# Patient Record
Sex: Male | Born: 1995 | Race: Black or African American | Hispanic: No | Marital: Single | Smoking: Current every day smoker
Health system: Southern US, Community
[De-identification: ages and names within clinical notes are randomized; demographics above are authoritative.]

## PROBLEM LIST (undated history)

## (undated) DIAGNOSIS — F4310 Post-traumatic stress disorder, unspecified: Secondary | ICD-10-CM

## (undated) DIAGNOSIS — Z8673 Personal history of transient ischemic attack (TIA), and cerebral infarction without residual deficits: Secondary | ICD-10-CM

## (undated) DIAGNOSIS — F32A Depression, unspecified: Secondary | ICD-10-CM

## (undated) DIAGNOSIS — S62309A Unspecified fracture of unspecified metacarpal bone, initial encounter for closed fracture: Secondary | ICD-10-CM

## (undated) DIAGNOSIS — J45909 Unspecified asthma, uncomplicated: Secondary | ICD-10-CM

## (undated) DIAGNOSIS — F329 Major depressive disorder, single episode, unspecified: Secondary | ICD-10-CM

## (undated) DIAGNOSIS — J302 Other seasonal allergic rhinitis: Secondary | ICD-10-CM

---
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
## 2016-01-23 ENCOUNTER — Ambulatory Visit (HOSPITAL_COMMUNITY)
Admission: RE | Admit: 2016-01-23 | Discharge: 2016-01-23 | Disposition: A | Discharge status: Home / Self Care | Payer: Medicaid Other | Source: Ambulatory Visit | Attending: Maternal and Fetal Medicine | Admitting: Maternal and Fetal Medicine

## 2016-01-23 DIAGNOSIS — Z31440 Encounter of male for testing for genetic disease carrier status for procreative management: Secondary | ICD-10-CM | POA: Diagnosis not present

## 2016-01-24 LAB — HEMOGLOBINOPATHY EVALUATION
HGB S QUANTITAION: 33.2 % — AB
Hgb A2 Quant: 4.1 % — ABNORMAL HIGH (ref 0.7–3.1)
Hgb A: 62.7 % — ABNORMAL LOW (ref 94.0–98.0)
Hgb C: 0 %
Hgb F Quant: 0 % (ref 0.0–2.0)

## 2016-04-14 ENCOUNTER — Emergency Department (HOSPITAL_COMMUNITY)
Admission: EM | Admit: 2016-04-14 | Discharge: 2016-04-14 | Disposition: A | Discharge status: Home / Self Care | Payer: Medicaid Other | Attending: Emergency Medicine | Admitting: Emergency Medicine

## 2016-04-14 ENCOUNTER — Encounter (HOSPITAL_COMMUNITY): Payer: Self-pay | Admitting: Emergency Medicine

## 2016-04-14 DIAGNOSIS — Z91040 Latex allergy status: Secondary | ICD-10-CM | POA: Insufficient documentation

## 2016-04-14 DIAGNOSIS — Z8673 Personal history of transient ischemic attack (TIA), and cerebral infarction without residual deficits: Secondary | ICD-10-CM | POA: Insufficient documentation

## 2016-04-14 DIAGNOSIS — L818 Other specified disorders of pigmentation: Secondary | ICD-10-CM | POA: Diagnosis not present

## 2016-04-14 DIAGNOSIS — J45909 Unspecified asthma, uncomplicated: Secondary | ICD-10-CM | POA: Insufficient documentation

## 2016-04-14 DIAGNOSIS — F17290 Nicotine dependence, other tobacco product, uncomplicated: Secondary | ICD-10-CM | POA: Diagnosis not present

## 2016-04-14 DIAGNOSIS — L923 Foreign body granuloma of the skin and subcutaneous tissue: Secondary | ICD-10-CM

## 2016-04-14 DIAGNOSIS — Z79899 Other long term (current) drug therapy: Secondary | ICD-10-CM | POA: Insufficient documentation

## 2016-04-14 DIAGNOSIS — L03114 Cellulitis of left upper limb: Secondary | ICD-10-CM

## 2016-04-14 LAB — I-STAT CG4 LACTIC ACID, ED: Lactic Acid, Venous: 1.4 mmol/L (ref 0.5–1.9)

## 2016-04-14 LAB — COMPREHENSIVE METABOLIC PANEL
ALK PHOS: 87 U/L (ref 38–126)
ALT: 24 U/L (ref 17–63)
AST: 25 U/L (ref 15–41)
Albumin: 4.1 g/dL (ref 3.5–5.0)
Anion gap: 8 (ref 5–15)
BILIRUBIN TOTAL: 1.4 mg/dL — AB (ref 0.3–1.2)
BUN: 9 mg/dL (ref 6–20)
CALCIUM: 9.6 mg/dL (ref 8.9–10.3)
CO2: 27 mmol/L (ref 22–32)
CREATININE: 0.95 mg/dL (ref 0.61–1.24)
Chloride: 103 mmol/L (ref 101–111)
Glucose, Bld: 94 mg/dL (ref 65–99)
Potassium: 3.7 mmol/L (ref 3.5–5.1)
Sodium: 138 mmol/L (ref 135–145)
TOTAL PROTEIN: 7.3 g/dL (ref 6.5–8.1)

## 2016-04-14 LAB — CBC WITH DIFFERENTIAL/PLATELET
BASOS ABS: 0 10*3/uL (ref 0.0–0.1)
Basophils Relative: 1 %
EOS ABS: 0.4 10*3/uL (ref 0.0–0.7)
Eosinophils Relative: 8 %
HCT: 42.2 % (ref 39.0–52.0)
Hemoglobin: 14 g/dL (ref 13.0–17.0)
Lymphocytes Relative: 37 %
Lymphs Abs: 1.7 10*3/uL (ref 0.7–4.0)
MCH: 26 pg (ref 26.0–34.0)
MCHC: 33.2 g/dL (ref 30.0–36.0)
MCV: 78.4 fL (ref 78.0–100.0)
Monocytes Absolute: 0.3 10*3/uL (ref 0.1–1.0)
Monocytes Relative: 7 %
Neutro Abs: 2.2 10*3/uL (ref 1.7–7.7)
Neutrophils Relative %: 47 %
Platelets: 123 10*3/uL — ABNORMAL LOW (ref 150–400)
RBC: 5.38 MIL/uL (ref 4.22–5.81)
RDW: 13.5 % (ref 11.5–15.5)
WBC: 4.7 10*3/uL (ref 4.0–10.5)

## 2016-04-14 MED ORDER — CLINDAMYCIN HCL 300 MG PO CAPS: 300.0000 mg | ORAL_CAPSULE | Freq: Four times a day (QID) | ORAL | 0 refills | Status: DC

## 2016-04-14 NOTE — ED Provider Notes (Addendum)
[facility] Provider Note   CSN: [id number] Arrival date & time: 04/14/16  0919     History   Chief Complaint Chief Complaint  Patient presents with  . Cellulitis    HPI Jason Harrell is a 21 y.o. male.  Patient is a 21 year old male with past mental history of asthma. He presents for evaluation of a possible infected tattoo. He reports having a tattoo to his left forearm performed by his uncle several days ago. Yesterday the area began to swell. He denies any fevers or chills. He denies any significant drainage.   The history is provided by the patient.    Past Medical History:  Diagnosis Date  . Asthma    prn inhaler  . Depression   . 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 Metacarpal bone fracture 08/20/2014   midshaft right ring and small  . PTSD (post-traumatic stress disorder)   . Seasonal allergies     Patient Active Problem List   Diagnosis Date Noted  . MDD (major depressive disorder), recurrent episode, severe (HCC) 06/01/2014  . Generalized anxiety disorder 06/01/2014  . Aggression 06/01/2014    Past Surgical History:  Procedure Laterality Date  . OPEN REDUCTION INTERNAL FIXATION (ORIF) METACARPAL Right 08/28/2014   Procedure: OPEN REDUCTION INTERNAL FIXATION (ORIF) RIGHT RING AND RIGHT SMALL FINGER METACARPAL FRACTURES ;  Surgeon: Dairl Ponder, MD;  Location: [facility];  Service: Orthopedics;  Laterality: Right;  . PERIPHERALLY INSERTED CENTRAL CATHETER INSERTION  2007   was sedated for insertion       Home Medications    Prior to Admission medications   Medication Sig Start Date End Date Taking? Authorizing Provider  albuterol (PROVENTIL HFA;VENTOLIN HFA) 108 (90 BASE) MCG/ACT inhaler Inhale 2 puffs into the lungs every 6 (six) hours as needed for wheezing or shortness of breath.     Historical Provider, MD  amoxicillin-clavulanate (AUGMENTIN) 875-125 MG per tablet Take 1 tablet by mouth  every 12 (twelve) hours. 09/22/14   Catha Gosselin, PA-C  clotrimazole (LOTRIMIN) 1 % cream Apply to affected area 2 times daily 11/24/15   Lorre Nick, MD  EPINEPHrine [facility]) 0.15 MG/0.3ML injection Inject 0.15 mg into the muscle daily as needed for anaphylaxis.     Historical Provider, MD  ibuprofen (ADVIL,MOTRIN) 600 MG tablet Take 1 tablet (600 mg total) by mouth every 6 (six) hours as needed for mild pain. 08/20/14   Marcellina Millin, MD  loratadine (CLARITIN) 10 MG tablet Take 10 mg by mouth daily.    Historical Provider, MD  mirtazapine (REMERON) 7.5 MG tablet Take 1 tablet (7.5 mg total) by mouth at bedtime. 06/08/14   Gayland Curry, MD  naproxen (NAPROSYN) 500 MG tablet Take 1 tablet (500 mg total) by mouth 2 (two) times daily. 09/22/14   Hanna Patel-Mills, PA-C  oxyCODONE-acetaminophen (ROXICET) 5-325 MG per tablet Take 1 tablet by mouth every 4 (four) hours as needed for severe pain. 08/28/14   Dairl Ponder, MD    Family History Family History  Problem Relation Age of Onset  . Sickle cell trait Mother     Social History Social History  Substance Use Topics  . Smoking status: Current Every Day Smoker    Years: 0.50    Types: Cigars  . Smokeless tobacco: Never Used     Comment: Black and Mild  . Alcohol use No     Allergies   Anchovies [fish allergy]; Penicillins;  Latex; and Lidocaine   Review of Systems Review of Systems  All other systems reviewed and are negative.    Physical Exam Updated Vital Signs BP 143/85 (BP Location: Right Arm)   Pulse 78   Temp 98 F (36.7 C) (Oral)   Resp 20   Ht 5\' 9"  (1.753 m)   Wt 215 lb (97.5 kg)   SpO2 99%   BMI 31.75 kg/m   Physical Exam  Constitutional: He is oriented to person, place, and time. He appears well-developed and well-nourished. No distress.  HENT:  Head: Normocephalic and atraumatic.  Neck: Normal range of motion. Neck supple.  Musculoskeletal: Normal range of motion.  The volar aspect of the  left forearm is noted to have a newly applied tattoo. There is what appears to be some necrotic tissue along with surrounding erythema present. (See attached photo). There is no significant drainage. He has good range of motion of the forearm without significant discomfort. Ulnar and radial pulses are palpable. Motor and sensation are intact throughout the entire hand.  Neurological: He is alert and oriented to person, place, and time.  Skin: Skin is warm and dry. He is not diaphoretic.  Nursing note and vitals reviewed.      ED Treatments / Results  Labs (all labs ordered are listed, but only abnormal results are displayed) Labs Reviewed  COMPREHENSIVE METABOLIC PANEL  CBC WITH DIFFERENTIAL/PLATELET  I-STAT CG4 LACTIC ACID, ED    EKG  EKG Interpretation None       Radiology No results found.  Procedures Procedures (including critical care time)  Medications Ordered in ED Medications - No data to display   Initial Impression / Assessment and Plan / ED Course  I have reviewed the triage vital signs and the nursing notes.  Pertinent labs & imaging results that were available during my care of the patient were reviewed by me and considered in my medical decision making (see chart for details).  Patient with cellulitis versus local reaction related to recent tattoo performed at home by his uncle. He will be treated with antibiotics and when necessary return.  I have discussed this with Dr. Janee Mornhompson from hand surgery who does not feel as though any further intervention or evaluation is necessary (with the exception of antibiotics).  Final Clinical Impressions(s) / ED Diagnoses   Final diagnoses:  None    New Prescriptions New Prescriptions   No medications on file     Geoffery Lyonsouglas Samaad Hashem, MD 04/14/16 1109    Geoffery Lyonsouglas Sherel Fennell, MD 04/15/16 [phone]

## 2016-04-14 NOTE — Discharge Instructions (Signed)
Clindamycin as prescribed.  Return to the emergency department for worsening pain, high fevers, increased redness or drainage, or for other new and concerning symptoms.

## 2016-04-14 NOTE — ED Triage Notes (Signed)
Pt reports getting a tattoo last Tuesday, noticed yesterday that skin around tattoo was painful and draining. Pt denies fever/chills.

## 2016-04-14 NOTE — ED Notes (Signed)
bactrin ointment applied to tattoo and wrap with ace bandage. Discharge instructions and prescriptions reviewed. Pt verbalized understanding.

## 2016-08-21 IMAGING — DX DG ANKLE COMPLETE 3+V*R*
3 series · 3 of 3 positions shown · non-contrast
Comparison: None

CLINICAL DATA: Dog bite to the anterior right ankle today.

EXAM:
RIGHT ANKLE - COMPLETE 3+ VIEW

[ankle ap]
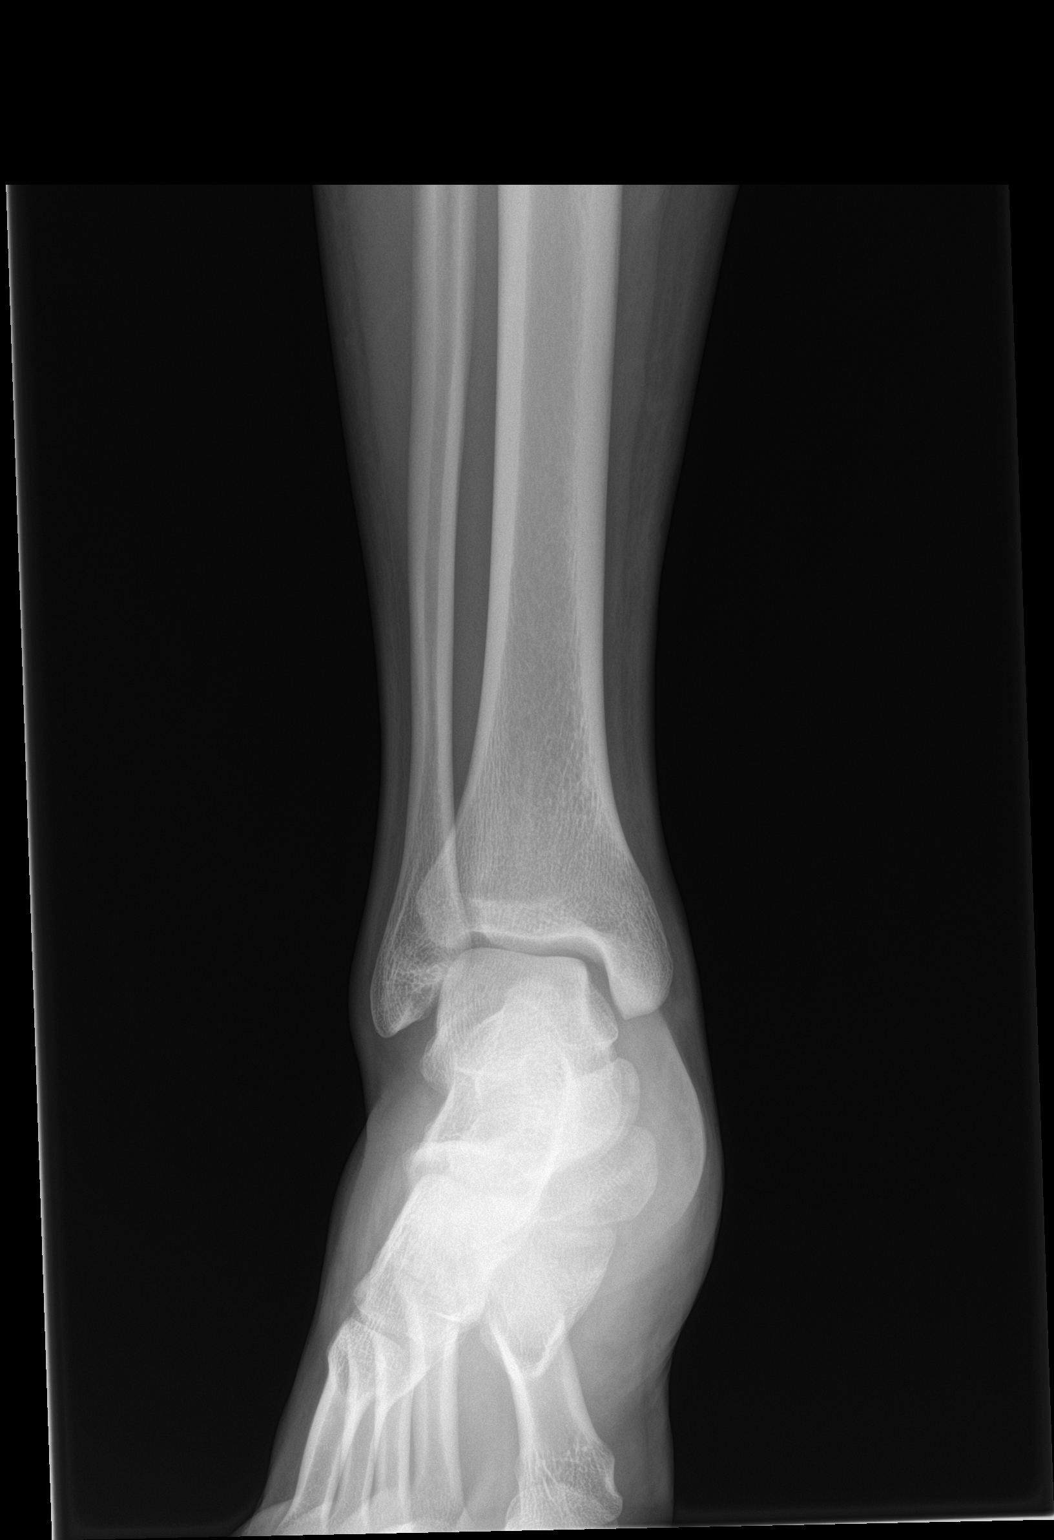

[ankle obl]
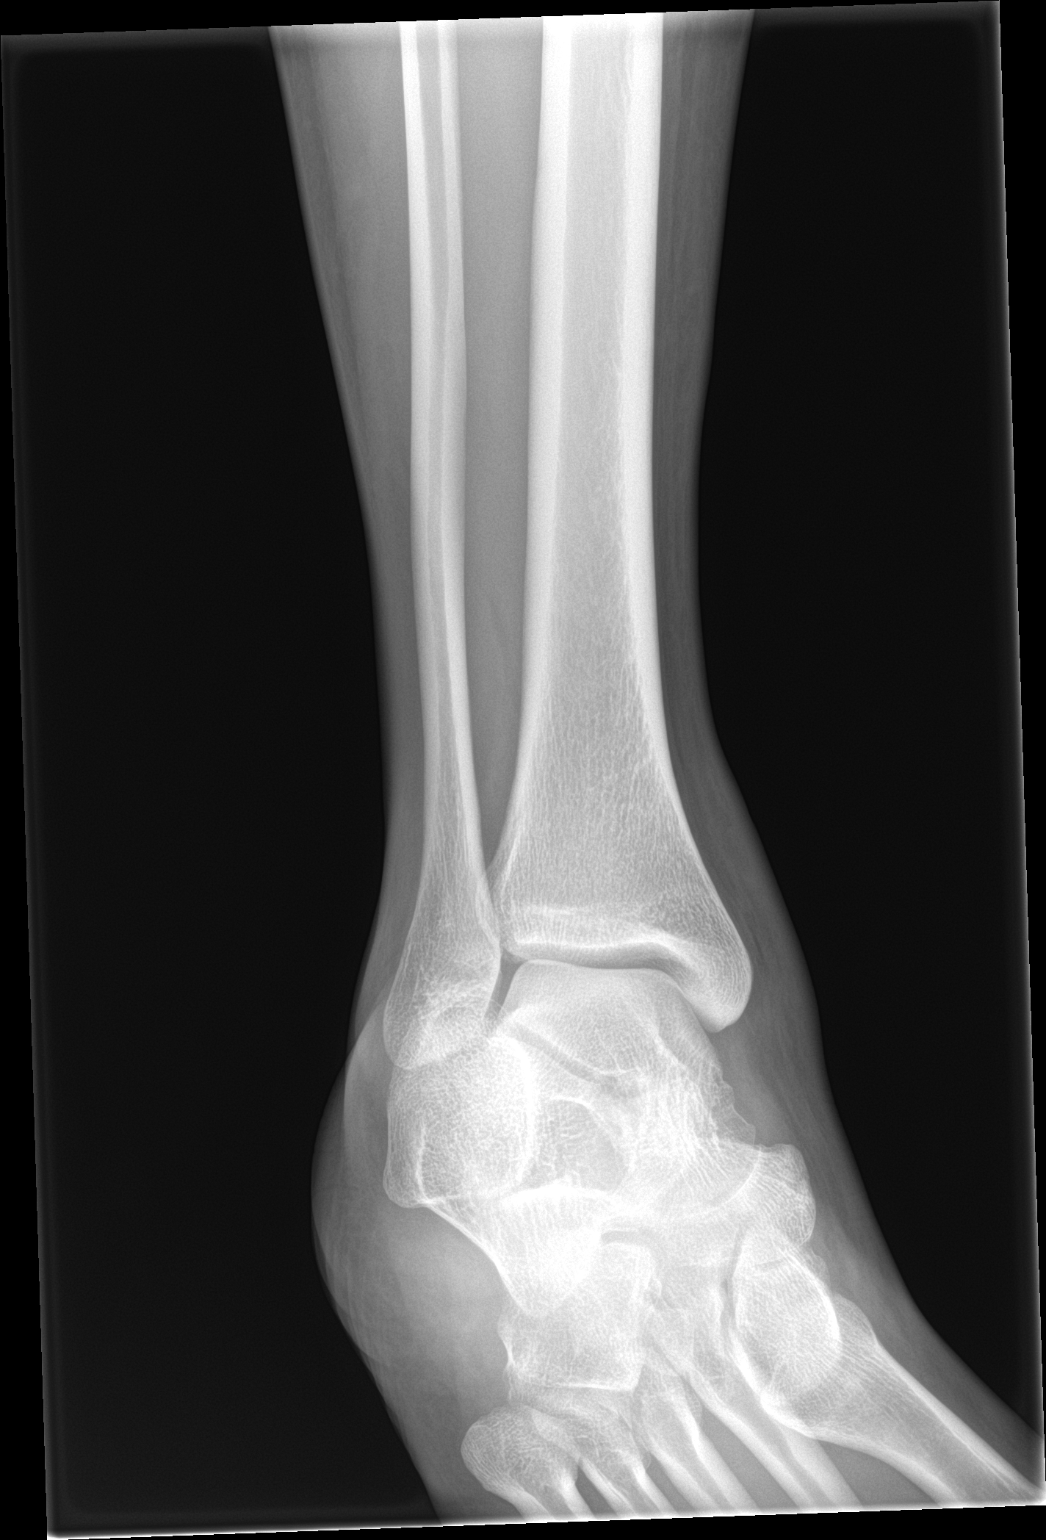

[ankle lat]
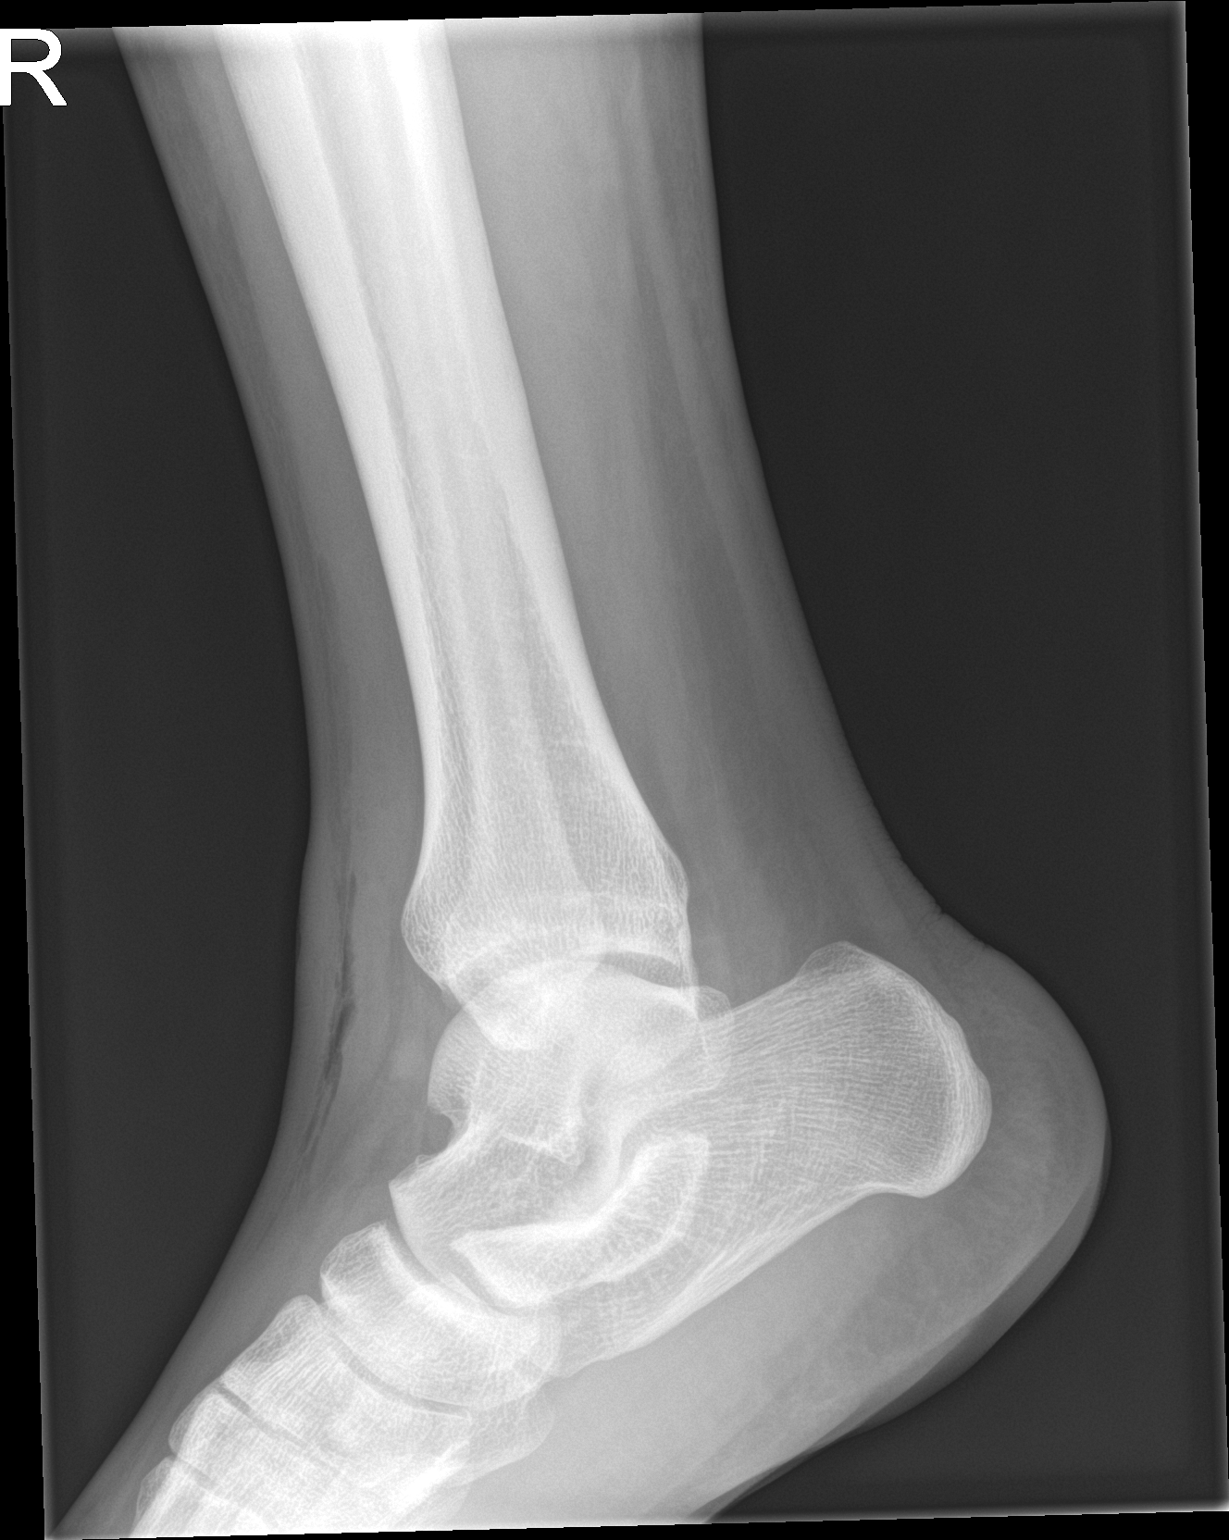

[3 of 3 positions shown; findings below may reference images not displayed]

FINDINGS: There is soft tissue swelling and subcutaneous soft tissue air along
the anterior aspect of the ankle. There is no radiopaque foreign
body. No fracture or bone lesion. Ankle mortise is normally spaced
and aligned. No evidence of an ankle joint effusion.
IMPRESSION: No fracture or ankle joint abnormality.  No radiopaque foreign body.

## 2016-11-13 ENCOUNTER — Emergency Department (HOSPITAL_COMMUNITY)
Admission: EM | Admit: 2016-11-13 | Discharge: 2016-11-14 | Disposition: A | Discharge status: Home / Self Care | Payer: Medicaid Other | Attending: Emergency Medicine | Admitting: Emergency Medicine

## 2016-11-13 ENCOUNTER — Encounter (HOSPITAL_COMMUNITY): Payer: Self-pay

## 2016-11-13 ENCOUNTER — Emergency Department (HOSPITAL_COMMUNITY): Payer: Medicaid Other

## 2016-11-13 DIAGNOSIS — Y929 Unspecified place or not applicable: Secondary | ICD-10-CM | POA: Diagnosis not present

## 2016-11-13 DIAGNOSIS — F17290 Nicotine dependence, other tobacco product, uncomplicated: Secondary | ICD-10-CM | POA: Insufficient documentation

## 2016-11-13 DIAGNOSIS — Y9389 Activity, other specified: Secondary | ICD-10-CM | POA: Insufficient documentation

## 2016-11-13 DIAGNOSIS — Z79899 Other long term (current) drug therapy: Secondary | ICD-10-CM | POA: Insufficient documentation

## 2016-11-13 DIAGNOSIS — S61421A Laceration with foreign body of right hand, initial encounter: Secondary | ICD-10-CM | POA: Diagnosis not present

## 2016-11-13 DIAGNOSIS — S6981XA Other specified injuries of right wrist, hand and finger(s), initial encounter: Secondary | ICD-10-CM | POA: Diagnosis present

## 2016-11-13 DIAGNOSIS — J45909 Unspecified asthma, uncomplicated: Secondary | ICD-10-CM | POA: Insufficient documentation

## 2016-11-13 DIAGNOSIS — W25XXXA Contact with sharp glass, initial encounter: Secondary | ICD-10-CM | POA: Insufficient documentation

## 2016-11-13 DIAGNOSIS — Z91040 Latex allergy status: Secondary | ICD-10-CM | POA: Insufficient documentation

## 2016-11-13 DIAGNOSIS — Y999 Unspecified external cause status: Secondary | ICD-10-CM | POA: Diagnosis not present

## 2016-11-13 NOTE — ED Provider Notes (Signed)
[facility] Provider Note   CSN: [id number] Arrival date & time: 11/13/16  2048     History   Chief Complaint Chief Complaint  Patient presents with  . Laceration    HPI Jason Harrell is a 21 y.o. male.  Patient states he punched his hand through the glass pane of a Physicist, medical. Laceration/avulsion noted to medial aspect of left hand at base of fifth finger and laceration of middle phalanx of left little finger.   The history is provided by the patient. No language interpreter was used.  Hand Injury   The incident occurred 3 to 5 hours ago. The injury mechanism was a direct blow. The pain is present in the right hand. The pain is mild. The pain has been fluctuating since the incident. He reports no foreign bodies present.    Past Medical History:  Diagnosis Date  . Asthma    prn inhaler  . Depression   . 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 Metacarpal bone fracture 08/20/2014   midshaft right ring and small  . PTSD (post-traumatic stress disorder)   . Seasonal allergies     Patient Active Problem List   Diagnosis Date Noted  . MDD (major depressive disorder), recurrent episode, severe (HCC) 06/01/2014  . Generalized anxiety disorder 06/01/2014  . Aggression 06/01/2014    Past Surgical History:  Procedure Laterality Date  . OPEN REDUCTION INTERNAL FIXATION (ORIF) METACARPAL Right 08/28/2014   Procedure: OPEN REDUCTION INTERNAL FIXATION (ORIF) RIGHT RING AND RIGHT SMALL FINGER METACARPAL FRACTURES ;  Surgeon: Dairl Ponder, MD;  Location: [facility];  Service: Orthopedics;  Laterality: Right;  . PERIPHERALLY INSERTED CENTRAL CATHETER INSERTION  2007   was sedated for insertion       Home Medications    Prior to Admission medications   Medication Sig Start Date End Date Taking? Authorizing Provider  albuterol (PROVENTIL HFA;VENTOLIN HFA) 108 (90 BASE) MCG/ACT inhaler Inhale 2 puffs into  the lungs every 6 (six) hours as needed for wheezing or shortness of breath.     [provider]  amoxicillin-clavulanate (AUGMENTIN) 875-125 MG per tablet Take 1 tablet by mouth every 12 (twelve) hours. 09/22/14   Patel-Mills, Lorelle Formosa, PA-C  clindamycin (CLEOCIN) 300 MG capsule Take 1 capsule (300 mg total) by mouth 4 (four) times daily. X 7 days 04/14/16   Geoffery Lyons, MD  clotrimazole (LOTRIMIN) 1 % cream Apply to affected area 2 times daily 11/24/15   Lorre Nick, MD  EPINEPHrine [facility] JR) 0.15 MG/0.3ML injection Inject 0.15 mg into the muscle daily as needed for anaphylaxis.     [provider]  ibuprofen (ADVIL,MOTRIN) 600 MG tablet Take 1 tablet (600 mg total) by mouth every 6 (six) hours as needed for mild pain. 08/20/14   Marcellina Millin, MD  loratadine (CLARITIN) 10 MG tablet Take 10 mg by mouth daily.    [provider]  mirtazapine (REMERON) 7.5 MG tablet Take 1 tablet (7.5 mg total) by mouth at bedtime. 06/08/14   Gayland Curry, MD  naproxen (NAPROSYN) 500 MG tablet Take 1 tablet (500 mg total) by mouth 2 (two) times daily. 09/22/14   Patel-Mills, Lorelle Formosa, PA-C  oxyCODONE-acetaminophen (ROXICET) 5-325 MG per tablet Take 1 tablet by mouth every 4 (four) hours as needed for severe pain. 08/28/14   Dairl Ponder, MD    Family History Family History  Problem Relation Age of Onset  . Sickle cell  trait Mother     Social History Social History  Substance Use Topics  . Smoking status: Current Every Day Smoker    Years: 0.50    Types: Cigars  . Smokeless tobacco: Never Used     Comment: Black and Mild  . Alcohol use No     Allergies   Anchovies [fish allergy]; Penicillins; Latex; and Lidocaine   Review of Systems Review of Systems  All other systems reviewed and are negative.    Physical Exam Updated Vital Signs BP 133/80   Pulse 98   Temp 99.7 F (37.6 C) (Oral)   Resp 20   Ht  (1.778 m)   Wt 92.1 kg (203 lb)   SpO2 97%   BMI  29.13 kg/m   Physical Exam  Constitutional: He appears well-developed and well-nourished.  HENT:  Head: Normocephalic and atraumatic.  Eyes: Conjunctivae are normal.  Neck: Neck supple.  Cardiovascular: Normal rate and regular rhythm.   Pulmonary/Chest: Effort normal and breath sounds normal.  Abdominal: Soft.  Musculoskeletal: Normal range of motion. He exhibits tenderness.       Left hand: He exhibits laceration. He exhibits normal range of motion. Normal sensation noted.       Hands: Neurological: He is alert.  Skin: Skin is warm and dry.  Psychiatric: He has a normal mood and affect.  Nursing note and vitals reviewed.        ED Treatments / Results  Labs (all labs ordered are listed, but only abnormal results are displayed) Labs Reviewed - No data to display  EKG  EKG Interpretation None       Radiology Dg Hand Complete Right  Result Date: 11/13/2016 CLINICAL DATA:  Right hand laceration after hitting glass this evening. EXAM: RIGHT HAND - COMPLETE 3+ VIEW COMPARISON:  08/20/2014 FINDINGS: Soft tissue laceration along the ulnar aspect of the right fifth digit without radiopaque foreign body, acute fracture or joint dislocation. Carpal rows are maintained. The distal radius and ulna are nonacute. ORIF of the fourth and fifth metacarpals without hardware failure. IMPRESSION: 1. Soft tissue laceration of the right pinky without radiopaque foreign body nor osseous involvement. 2. ORIF of the fourth and fifth metacarpals with healing of previously noted metacarpal shaft fractures. Electronically Signed   By: Tollie Eth M.D.   On: 11/13/2016 22:00    Procedures .Marland KitchenLaceration Repair Date/Time: 11/14/2016 1:34 AM Performed by: Katrinka Blazing, Quetzally Callas Authorized by: Katrinka Blazing, Fletcher Rathbun   Consent:    Consent obtained:  Verbal   Consent given by:  Patient and parent   Risks discussed:  Infection, poor cosmetic result, need for additional repair and poor wound healing   Alternatives  discussed:  No treatment and delayed treatment Anesthesia (see MAR for exact dosages):    Anesthesia method:  None Laceration details:    Location:  Hand   Hand location:  R palm Repair type:    Repair type:  Simple Pre-procedure details:    Preparation:  Patient was prepped and draped in usual sterile fashion and imaging obtained to evaluate for foreign bodies Exploration:    Wound exploration: entire depth of wound probed and visualized     Contaminated: no   Treatment:    Area cleansed with:  Betadine   Amount of cleaning:  Standard   Irrigation solution:  Tap water   Irrigation method:  Tap   Visualized foreign bodies/material removed: yes   Skin repair:    Repair method:  Steri-Strips   Number of Steri-Strips:  3 Approximation:    Approximation:  Loose Post-procedure details:    Dressing:  Antibiotic ointment and non-adherent dressing   Patient tolerance of procedure:  Tolerated well, no immediate complications   (including critical care time)  Medications Ordered in ED Medications - No data to display   Initial Impression / Assessment and Plan / ED Course  I have reviewed the triage vital signs and the nursing notes.  Pertinent labs & imaging results that were available during my care of the patient were reviewed by me and considered in my medical decision making (see chart for details).     Tetanus UTD. Laceration occurred < 12 hours prior to repair. Patient allergic to lidocaine. Discussed alternative wound closure. Betadine soak and tap water irrigation. Debridement of non-viable skin. Steristrip application with bacitracin dressing. Discussed laceration care with pt and answered questions. Pt to f-u for  wound check  should there be signs of infection. Pt is hemodynamically stable with no complaints prior to dc.    Final Clinical Impressions(s) / ED Diagnoses   Final diagnoses:  Laceration of right hand with foreign body, initial encounter    New  Prescriptions New Prescriptions   No medications on file     Felicie Morn, NP 11/14/16 0136    Ward, Layla Maw, DO 11/14/16 [phone]

## 2016-11-13 NOTE — ED Triage Notes (Signed)
Pt hit some glass, three small lacerations with small avulsion to R pinky, PMS intact.

## 2016-11-14 NOTE — ED Notes (Signed)
Treatment complete. Dressing per PA. Extra small Kerlix roll sent home with patient.

## 2016-11-14 NOTE — ED Notes (Signed)
Wound soaking in betadine/NS solution

## 2018-10-13 IMAGING — CR DG HAND COMPLETE 3+V*R*
3 series · 3 of 3 positions shown · non-contrast
Comparison: 08/20/2014

CLINICAL DATA: Right hand laceration after hitting glass this
evening.

EXAM:
RIGHT HAND - COMPLETE 3+ VIEW

[hand pa]
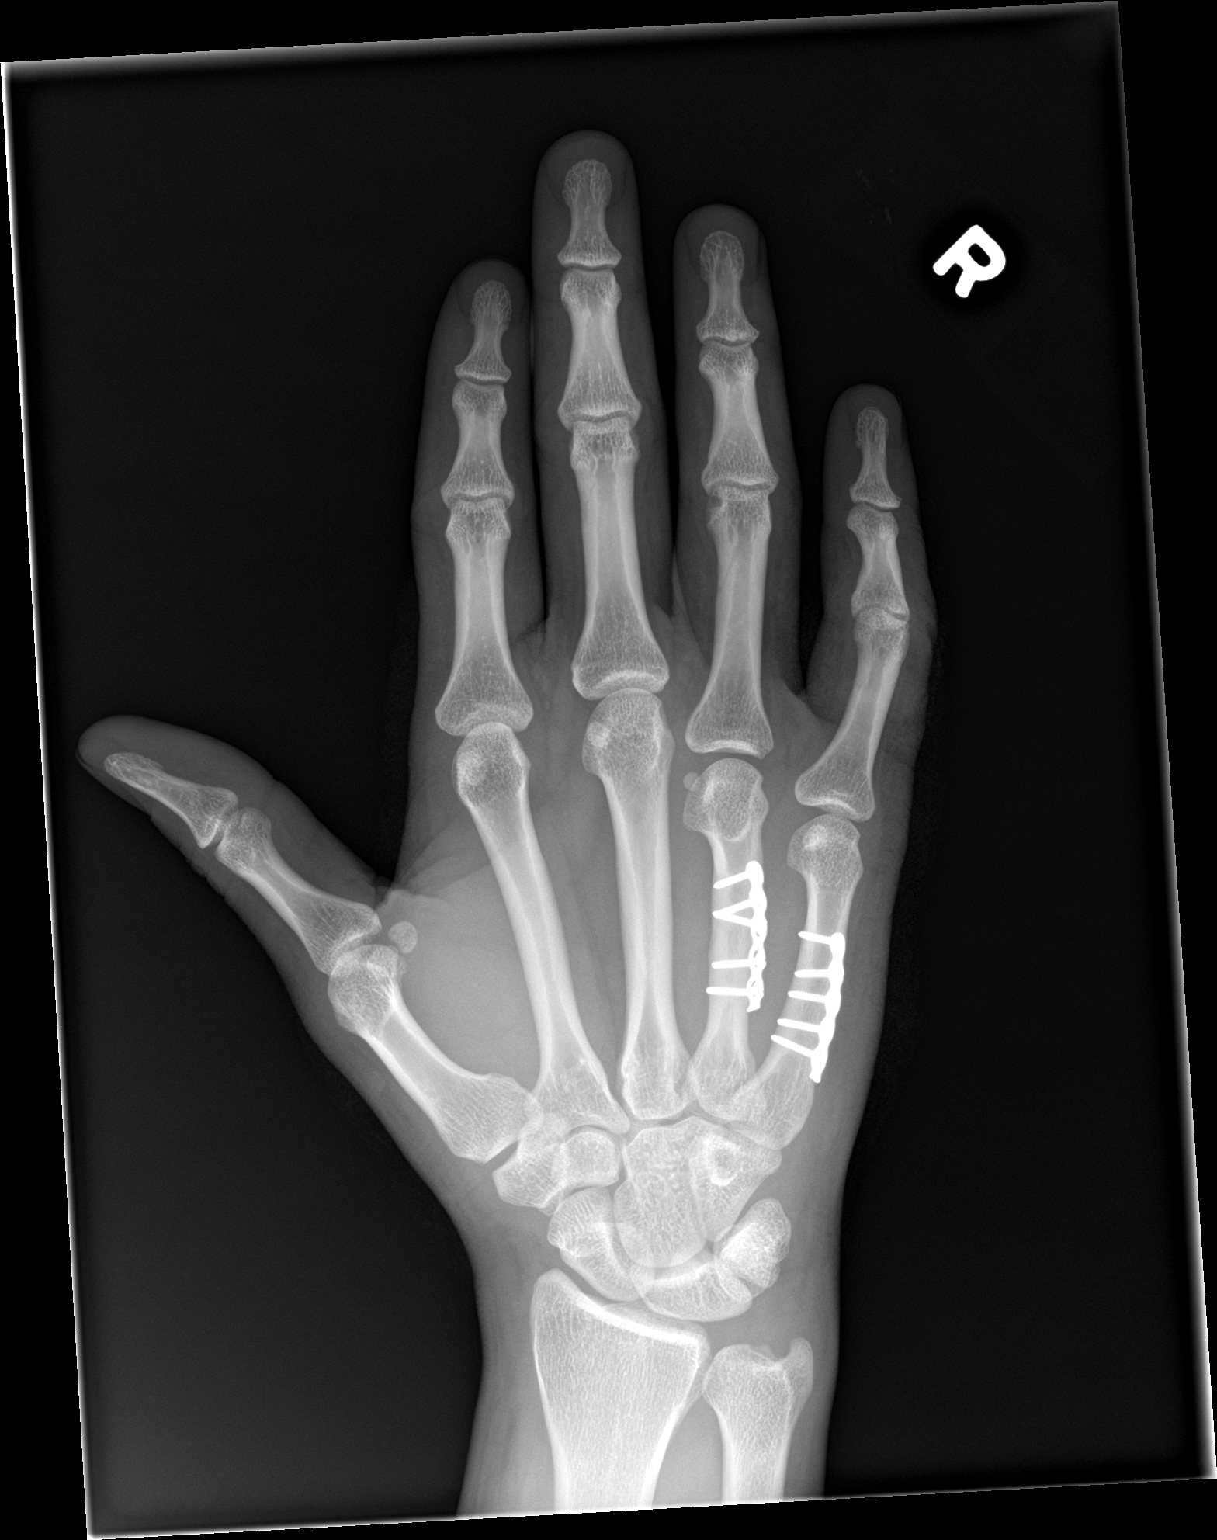

[hand obl]
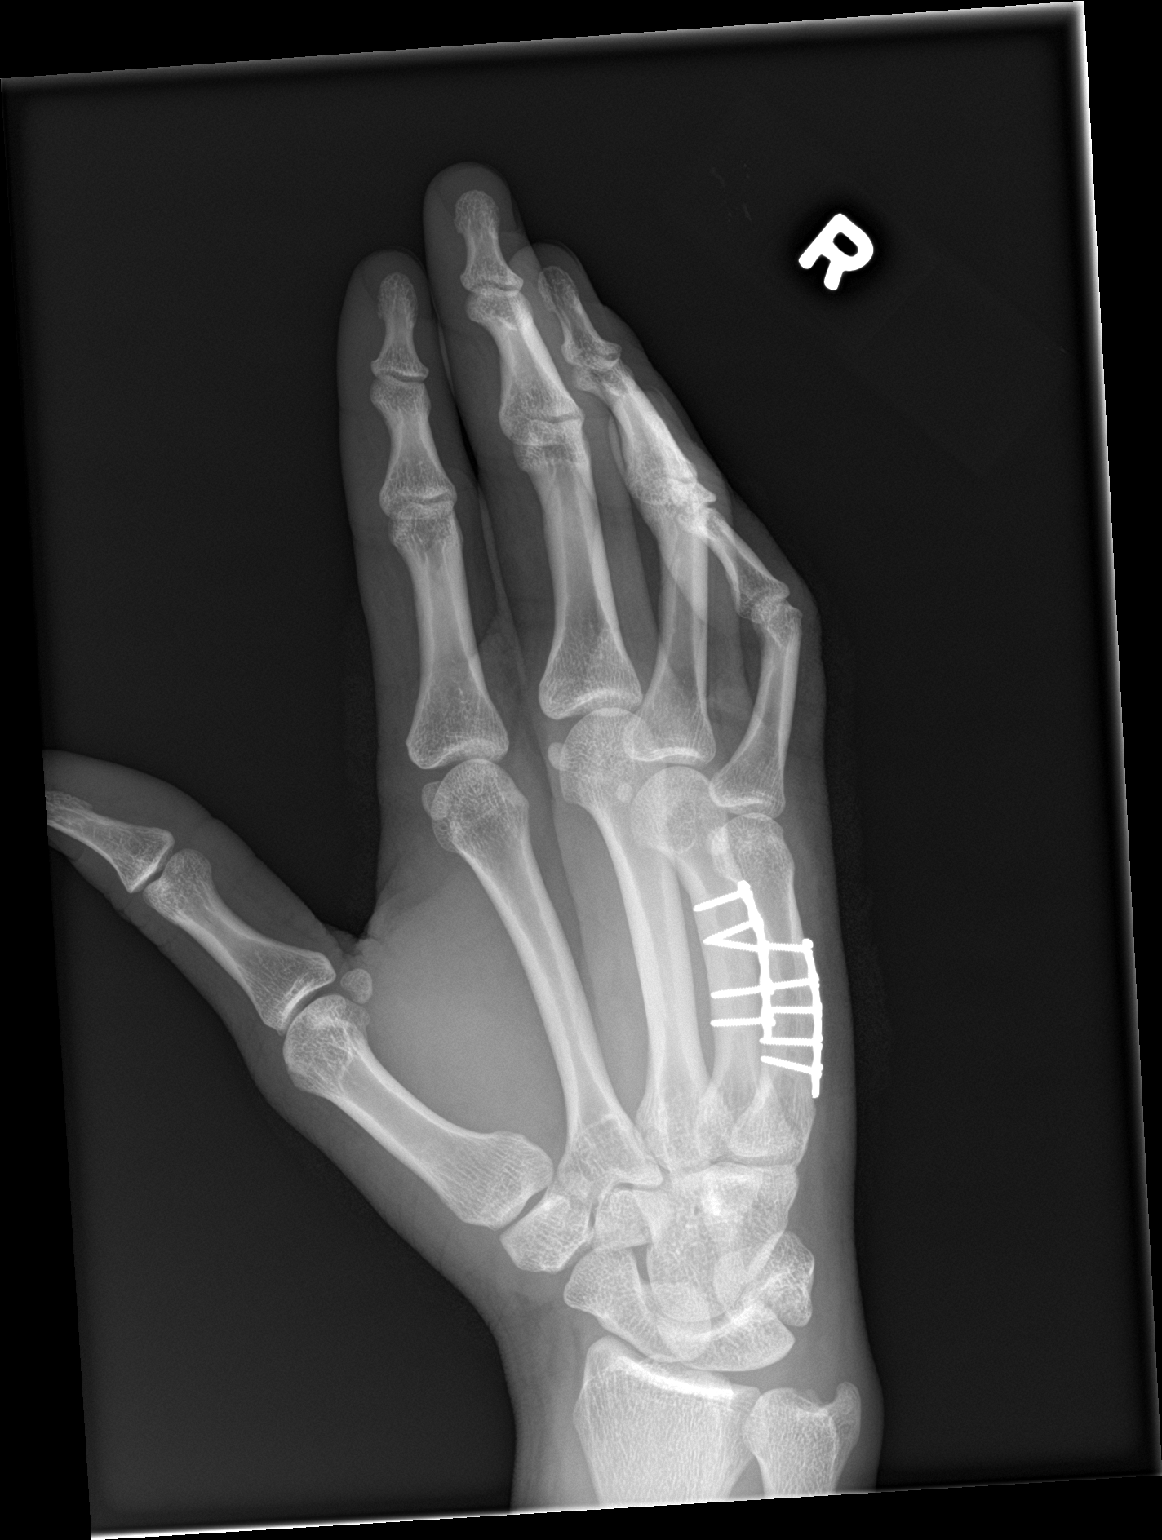

[hand lat]
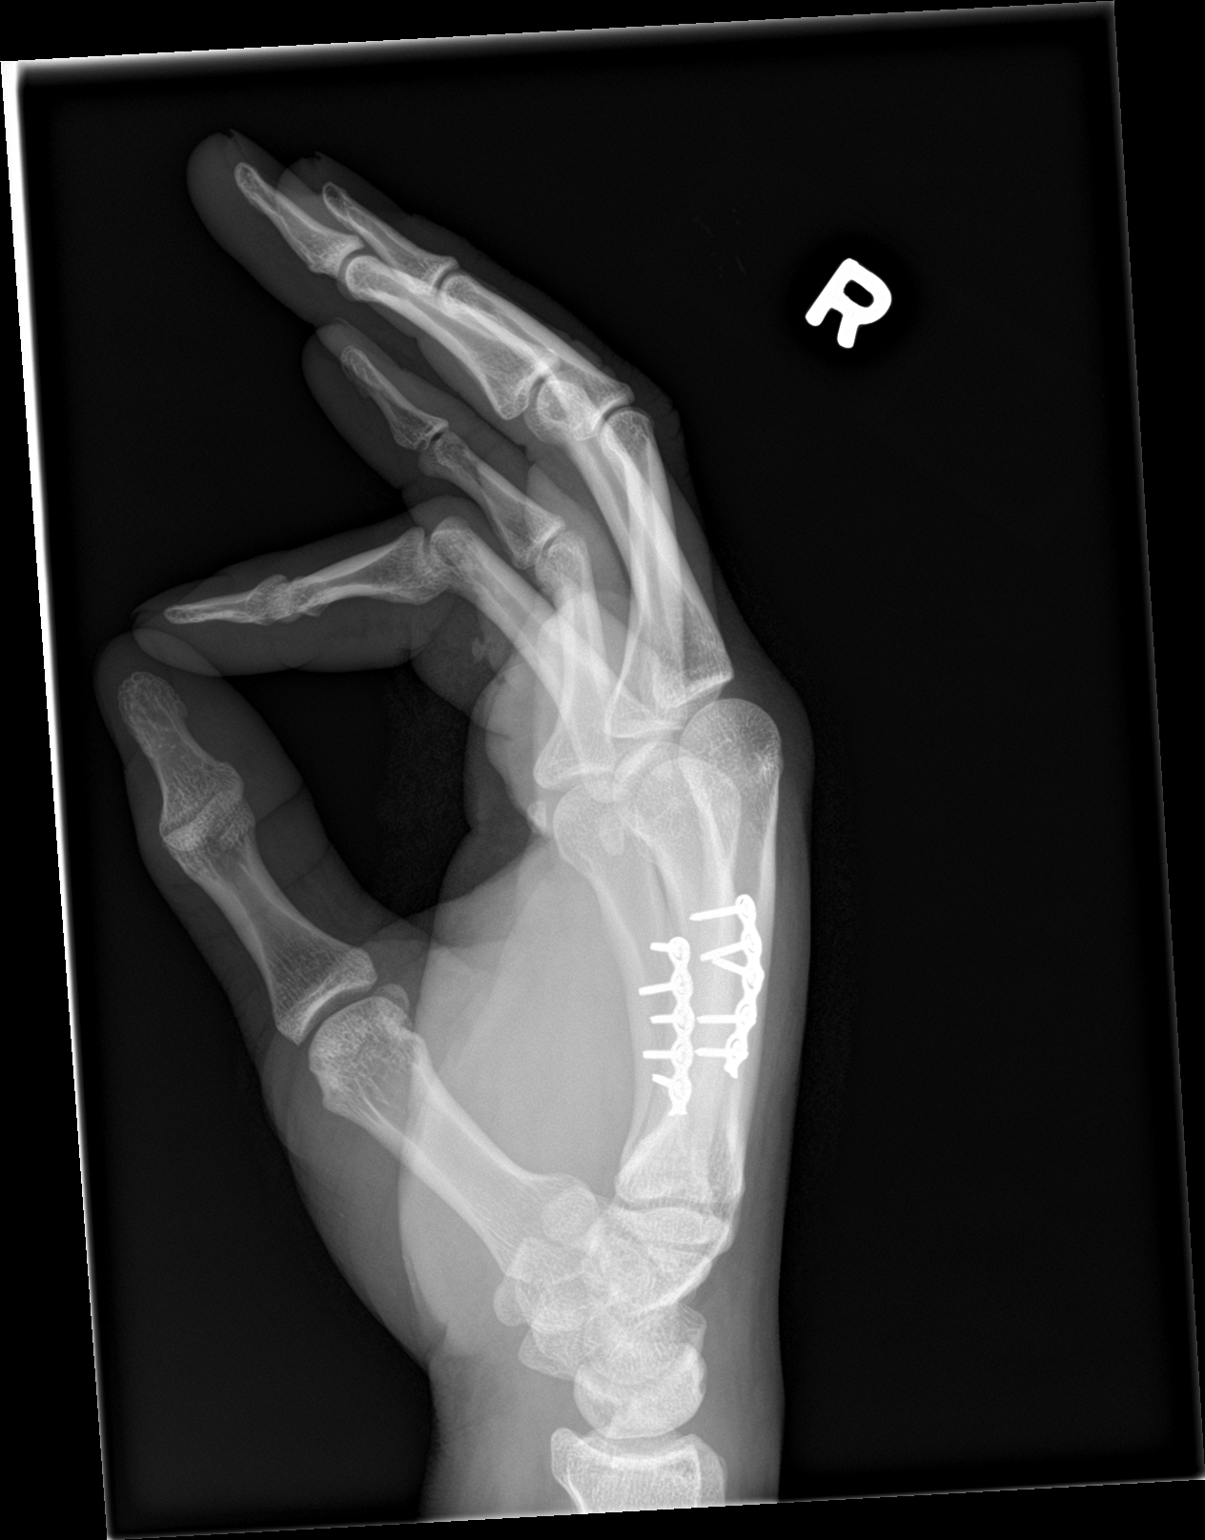

[3 of 3 positions shown; findings below may reference images not displayed]

FINDINGS: Soft tissue laceration along the ulnar aspect of the right fifth
digit without radiopaque foreign body, acute fracture or joint
dislocation. Carpal rows are maintained. The distal radius and ulna
are nonacute. ORIF of the fourth and fifth metacarpals without
hardware failure.
IMPRESSION: 1. Soft tissue laceration of the right pinky without radiopaque
foreign body nor osseous involvement.
2. ORIF of the fourth and fifth metacarpals with healing of
previously noted metacarpal shaft fractures.

## 2020-07-04 ENCOUNTER — Ambulatory Visit (HOSPITAL_COMMUNITY)
Admission: EM | Admit: 2020-07-04 | Discharge: 2020-07-04 | Disposition: A | Discharge status: Home / Self Care | Payer: Self-pay | Attending: Physician Assistant | Admitting: Physician Assistant

## 2020-07-04 ENCOUNTER — Ambulatory Visit (INDEPENDENT_AMBULATORY_CARE_PROVIDER_SITE_OTHER): Payer: Self-pay

## 2020-07-04 ENCOUNTER — Encounter (HOSPITAL_COMMUNITY): Payer: Self-pay | Admitting: Emergency Medicine

## 2020-07-04 ENCOUNTER — Other Ambulatory Visit: Payer: Self-pay

## 2020-07-04 DIAGNOSIS — M79641 Pain in right hand: Secondary | ICD-10-CM

## 2020-07-04 DIAGNOSIS — M25531 Pain in right wrist: Secondary | ICD-10-CM

## 2020-07-04 DIAGNOSIS — W19XXXA Unspecified fall, initial encounter: Secondary | ICD-10-CM

## 2020-07-04 NOTE — Discharge Instructions (Signed)
Your x-rays were normal.  Please keep brace on for several days to help manage her pain.  Alternate Tylenol and ibuprofen for pain relief.  If your symptoms do not improve follow-up with orthopedics.

## 2020-07-04 NOTE — ED Provider Notes (Signed)
[facility]    CSN: [id number] Arrival date & time: 07/04/20  1904      History   Chief Complaint Chief Complaint  Patient presents with  . Hand Pain    Right     HPI Jason Harrell is a 25 y.o. male.   Patient presents today with a several hour history of right hand and wrist pain after falling.  Reports that he fell out of bed this morning onto outstretched right hand and has had pain since that time.  Pain is rated 8 on a 0-10 pain scale, localized to ulnar right wrist with radiation to fifth metacarpal, described as aching, worse with movement or palpation.  He denies any numbness or tingling.  He is right-handed.  He does have a history of previous fourth and fifth metacarpal fracture requiring surgical fixation approximately 5 years ago.  He has tried ibuprofen without improvement of symptoms.     Past Medical History:  Diagnosis Date  . Asthma    prn inhaler  . Depression   . 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 Metacarpal bone fracture 08/20/2014   midshaft right ring and small  . PTSD (post-traumatic stress disorder)   . Seasonal allergies     Patient Active Problem List   Diagnosis Date Noted  . MDD (major depressive disorder), recurrent episode, severe (HCC) 06/01/2014  . Generalized anxiety disorder 06/01/2014  . Aggression 06/01/2014    Past Surgical History:  Procedure Laterality Date  . OPEN REDUCTION INTERNAL FIXATION (ORIF) METACARPAL Right 08/28/2014   Procedure: OPEN REDUCTION INTERNAL FIXATION (ORIF) RIGHT RING AND RIGHT SMALL FINGER METACARPAL FRACTURES ;  Surgeon: Dairl Ponder, MD;  Location: [facility];  Service: Orthopedics;  Laterality: Right;  . PERIPHERALLY INSERTED CENTRAL CATHETER INSERTION  2007   was sedated for insertion       Home Medications    Prior to Admission medications   Medication Sig Start Date End Date Taking? Authorizing Provider  ibuprofen  (ADVIL,MOTRIN) 600 MG tablet Take 1 tablet (600 mg total) by mouth every 6 (six) hours as needed for mild pain. 08/20/14  Yes Marcellina Millin, MD  albuterol (PROVENTIL HFA;VENTOLIN HFA) 108 (90 BASE) MCG/ACT inhaler Inhale 2 puffs into the lungs every 6 (six) hours as needed for wheezing or shortness of breath.     [provider]  clotrimazole (LOTRIMIN) 1 % cream Apply to affected area 2 times daily 11/24/15   Lorre Nick, MD  EPINEPHrine [facility]) 0.15 MG/0.3ML injection Inject 0.15 mg into the muscle daily as needed for anaphylaxis.     [provider]  loratadine (CLARITIN) 10 MG tablet Take 10 mg by mouth daily.    [provider]  mirtazapine (REMERON) 7.5 MG tablet Take 1 tablet (7.5 mg total) by mouth at bedtime. 06/08/14 07/04/20  Gayland Curry, MD    Family History Family History  Problem Relation Age of Onset  . Sickle cell trait Mother     Social History Social History   Tobacco Use  . Smoking status: Current Every Day Smoker    Years: 0.50    Types: Cigars  . Smokeless tobacco: Never Used  . Tobacco comment: Black and Mild  Substance Use Topics  . Alcohol use: No  . Drug use: No     Allergies   Anchovies [fish allergy], Penicillins, Latex, and Lidocaine   Review of Systems Review of Systems  Constitutional:  Positive for activity change. Negative for appetite change, fatigue and fever.  Respiratory: Negative for cough and shortness of breath.   Cardiovascular: Negative for chest pain.  Gastrointestinal: Negative for abdominal pain, diarrhea, nausea and vomiting.  Musculoskeletal: Positive for arthralgias and joint swelling. Negative for myalgias.  Neurological: Negative for dizziness, weakness, light-headedness, numbness and headaches.     Physical Exam Triage Vital Signs ED Triage Vitals  Enc Vitals Group     BP 07/04/20 1948 (!) 144/82     Pulse Rate 07/04/20 1948 61     Resp 07/04/20 1948 18     Temp 07/04/20 1948  98.7 F (37.1 C)     Temp Source 07/04/20 1948 Oral     SpO2 07/04/20 1948 98 %     Weight --      Height --      Head Circumference --      Peak Flow --      Pain Score 07/04/20 1944 8     Pain Loc --      Pain Edu? --      Excl. in GC? --    No data found.  Updated Vital Signs BP (!) 144/82 (BP Location: Right Arm)   Pulse 61   Temp 98.7 F (37.1 C) (Oral)   Resp 18   SpO2 98%   Visual Acuity Right Eye Distance:   Left Eye Distance:   Bilateral Distance:    Right Eye Near:   Left Eye Near:    Bilateral Near:     Physical Exam Vitals reviewed.  Constitutional:      General: He is awake.     Appearance: Normal appearance. He is normal weight. He is not ill-appearing.     Comments: Very pleasant male appears stated age in no acute distress  HENT:     Head: Normocephalic and atraumatic.     Mouth/Throat:     Pharynx: No oropharyngeal exudate, posterior oropharyngeal erythema or uvula swelling.  Cardiovascular:     Rate and Rhythm: Normal rate and regular rhythm.     Pulses:          Radial pulses are 2+ on the right side and 2+ on the left side.     Heart sounds: No murmur heard.     Comments: Capillary refill within 2 seconds bilateral hands Pulmonary:     Effort: Pulmonary effort is normal.     Breath sounds: Normal breath sounds. No stridor. No wheezing, rhonchi or rales.  Abdominal:     General: Bowel sounds are normal.     Palpations: Abdomen is soft.     Tenderness: There is no abdominal tenderness.  Musculoskeletal:     Right wrist: Bony tenderness present. No swelling, deformity, tenderness or snuff box tenderness. Decreased range of motion. Normal pulse.     Right hand: Swelling, tenderness and bony tenderness present. No deformity. Decreased range of motion. Decreased strength. There is no disruption of two-point discrimination. Normal capillary refill.     Comments: Right hand/wrist: Tenderness palpation over ulnar wrist along the fifth metacarpal.   No deformity noted.  Decreased range of motion with flexion and extension at wrist.  Normal extension of phalanges but decreased flexion.  Decreased grip and pincer grasp strength.  Hand neurovascularly intact.  No snuffbox tenderness.  Neurological:     Mental Status: He is alert.  Psychiatric:        Behavior: Behavior is cooperative.      UC Treatments /  Results  Labs (all labs ordered are listed, but only abnormal results are displayed) Labs Reviewed - No data to display  EKG   Radiology DG Wrist Complete Right  Result Date: 07/04/2020 CLINICAL DATA:  Fall onto outstretched hand EXAM: RIGHT WRIST - COMPLETE 3+ VIEW COMPARISON:  None. FINDINGS: There is no evidence of fracture or dislocation. There is no evidence of arthropathy or other focal bone abnormality. Soft tissues are unremarkable. IMPRESSION: Negative. Electronically Signed   By: Deatra Robinson M.D.   On: 07/04/2020 20:50   DG Hand Complete Right  Result Date: 07/04/2020 CLINICAL DATA:  Fall onto outstretched hand EXAM: RIGHT HAND - COMPLETE 3+ VIEW COMPARISON:  None. FINDINGS: There is no evidence of fracture or dislocation. There is no evidence of arthropathy or other focal bone abnormality. Soft tissues are unremarkable. Remote surgical fixation of the fourth and fifth metacarpals. IMPRESSION: Negative. Electronically Signed   By: Deatra Robinson M.D.   On: 07/04/2020 20:49    Procedures Procedures (including critical care time)  Medications Ordered in UC Medications - No data to display  Initial Impression / Assessment and Plan / UC Course  I have reviewed the triage vital signs and the nursing notes.  Pertinent labs & imaging results that were available during my care of the patient were reviewed by me and considered in my medical decision making (see chart for details).     X-ray of wrist and hand were normal.  Patient was placed in wrist brace.  Recommend he alternate Tylenol and ibuprofen for pain relief.   Recommended he use ice to help with swelling and pain.  He was provided contact information for orthopedics should symptoms persist.  Strict return precautions given to which patient expressed understanding.  Final Clinical Impressions(s) / UC Diagnoses   Final diagnoses:  Right hand pain  Right wrist pain  Fall, initial encounter     Discharge Instructions     Your x-rays were normal.  Please keep brace on for several days to help manage her pain.  Alternate Tylenol and ibuprofen for pain relief.  If your symptoms do not improve follow-up with orthopedics.    ED Prescriptions    None     PDMP not reviewed this encounter.   Jeani Hawking, PA-C 07/04/20 2057

## 2020-07-04 NOTE — ED Triage Notes (Signed)
Pt presents today with c/o of right hand pain. He reports landing on it this morning when he fell off his bed. He has old boxer fracture to right hand.

## 2023-02-28 ENCOUNTER — Telehealth (HOSPITAL_COMMUNITY): Payer: Self-pay | Admitting: Family Medicine

## 2023-02-28 MED ORDER — OSELTAMIVIR PHOSPHATE 75 MG PO CAPS: 75.0000 mg | ORAL_CAPSULE | Freq: Every day | ORAL | 0 refills | Status: AC

## 2023-02-28 NOTE — Telephone Encounter (Signed)
 Child with + influenza.  Meds ordered this encounter  Medications   oseltamivir (TAMIFLU) 75 MG capsule    Sig: Take 1 capsule (75 mg total) by mouth daily.    Dispense:  10 capsule    Refill:  0

## 2023-12-05 ENCOUNTER — Telehealth: Payer: Self-pay | Admitting: Nurse Practitioner
